# Patient Record
Sex: Male | Born: 1965 | Race: White | Hispanic: No | Marital: Married | Smoking: Never smoker
Health system: Southern US, Community
[De-identification: ages and names within clinical notes are randomized; demographics above are authoritative.]

## PROBLEM LIST (undated history)

## (undated) DIAGNOSIS — M109 Gout, unspecified: Principal | ICD-10-CM

## (undated) DIAGNOSIS — M1990 Unspecified osteoarthritis, unspecified site: Secondary | ICD-10-CM

## (undated) DIAGNOSIS — I2699 Other pulmonary embolism without acute cor pulmonale: Secondary | ICD-10-CM

## (undated) HISTORY — PX: CHOLECYSTECTOMY: SHX55

## (undated) HISTORY — DX: Gout, unspecified: M10.9

---

## 2013-02-14 ENCOUNTER — Ambulatory Visit: Payer: Self-pay | Admitting: Family Medicine

## 2013-02-14 DIAGNOSIS — Z0289 Encounter for other administrative examinations: Secondary | ICD-10-CM

## 2013-02-15 ENCOUNTER — Ambulatory Visit (INDEPENDENT_AMBULATORY_CARE_PROVIDER_SITE_OTHER): Payer: BC Managed Care – PPO | Admitting: Family Medicine

## 2013-02-15 ENCOUNTER — Encounter: Payer: Self-pay | Admitting: Family Medicine

## 2013-02-15 VITALS — BP 114/66 | HR 63 | Wt 189.0 lb

## 2013-02-15 DIAGNOSIS — M109 Gout, unspecified: Secondary | ICD-10-CM

## 2013-02-15 DIAGNOSIS — R5381 Other malaise: Secondary | ICD-10-CM

## 2013-02-15 DIAGNOSIS — R079 Chest pain, unspecified: Secondary | ICD-10-CM

## 2013-02-15 DIAGNOSIS — R5383 Other fatigue: Secondary | ICD-10-CM | POA: Insufficient documentation

## 2013-02-15 HISTORY — DX: Gout, unspecified: M10.9

## 2013-02-15 MED ORDER — COLCHICINE 0.6 MG PO TABS: 0.6000 mg | ORAL_TABLET | Freq: Every day | ORAL | Status: DC

## 2013-02-15 MED ORDER — ALLOPURINOL 100 MG PO TABS: 100.0000 mg | ORAL_TABLET | Freq: Every day | ORAL | Status: DC

## 2013-02-15 MED ORDER — MELOXICAM 15 MG PO TABS: 15.0000 mg | ORAL_TABLET | Freq: Every day | ORAL | Status: DC

## 2013-02-15 MED ORDER — CYCLOBENZAPRINE HCL 10 MG PO TABS: ORAL_TABLET | ORAL | Status: DC
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
## 2013-03-18 ENCOUNTER — Ambulatory Visit: Payer: BC Managed Care – PPO | Admitting: Family Medicine

## 2013-03-18 DIAGNOSIS — Z0289 Encounter for other administrative examinations: Secondary | ICD-10-CM

## 2015-07-03 ENCOUNTER — Ambulatory Visit (INDEPENDENT_AMBULATORY_CARE_PROVIDER_SITE_OTHER): Payer: BLUE CROSS/BLUE SHIELD | Admitting: Family Medicine

## 2015-07-03 ENCOUNTER — Encounter: Payer: Self-pay | Admitting: Family Medicine

## 2015-07-03 VITALS — BP 140/85 | HR 61 | Wt 196.0 lb

## 2015-07-03 DIAGNOSIS — M1A9XX1 Chronic gout, unspecified, with tophus (tophi): Secondary | ICD-10-CM

## 2015-07-03 DIAGNOSIS — N529 Male erectile dysfunction, unspecified: Secondary | ICD-10-CM | POA: Diagnosis not present

## 2015-07-03 DIAGNOSIS — M10079 Idiopathic gout, unspecified ankle and foot: Secondary | ICD-10-CM | POA: Diagnosis not present

## 2015-07-03 DIAGNOSIS — M109 Gout, unspecified: Secondary | ICD-10-CM | POA: Diagnosis not present

## 2015-07-03 MED ORDER — COLCHICINE 0.6 MG PO TABS: 0.6000 mg | ORAL_TABLET | Freq: Every day | ORAL | Status: DC

## 2015-07-03 MED ORDER — SILDENAFIL CITRATE 20 MG PO TABS: ORAL_TABLET | ORAL | Status: DC

## 2015-07-03 MED ORDER — ALLOPURINOL 300 MG PO TABS: 300.0000 mg | ORAL_TABLET | Freq: Every day | ORAL | Status: DC

## 2015-07-03 MED ORDER — PREDNISONE 20 MG PO TABS: ORAL_TABLET | ORAL | Status: AC

## 2015-07-03 NOTE — Progress Notes (Signed)
CC: Grant Ruiz is a 49 y.o. male is here for hospital f/u   Subjective: HPI:  Left ankle pain present for the last 3 weeks, present on a daily basis. He was evaluated at a local emergency room and he had normal x-rays. Symptoms are located deep in the ankle and on the front of the ankle. It stays swollen but is worse after a day of walking. Pain is worse when going down a stair or stepping off a curb. Treatment has included ibuprofen and oxycodone however he does not like taking oxycodone due to side effects and fear of getting addicted. Occasionally the joint feels warm. Same thing happen in the right ankle one month ago but improved after a few days of wearing a cough ankle brace. There is been no fevers, chills, nor joint swelling elsewhere. Denies any overlying skin changes other than swelling and some redness. Denies any recent or remote trauma to the joint.  Complaints of difficulty initiating and maintaining an erection for at least a few months now. He denies any other genitourinary complaints. He is still physically attracted towards his wife however cannot engage in sexual encounters due to erectile issues. Denies penile pain or any discharge. No interventions as of yet   Review Of Systems Outlined In HPI  Past Medical History  Diagnosis Date  . Gout 02/15/2013    No past surgical history on file. Family History  Problem Relation Age of Onset  . Alcoholism Mother   . Diabetes Father   . Hyperlipidemia Father     mother  . Hypertension Father     mother  . Stroke Father     Social History   Social History  . Marital Status: Married    Spouse Name: N/A  . Number of Children: N/A  . Years of Education: N/A   Occupational History  . Not on file.   Social History Main Topics  . Smoking status: Never Smoker   . Smokeless tobacco: Current User    Types: Chew  . Alcohol Use: No  . Drug Use: No  . Sexual Activity: Yes   Other Topics Concern  . Not on file    Social History Narrative     Objective: BP 140/85 mmHg  Pulse 61  Wt 196 lb (88.905 kg)  Vital signs reviewed. General: Alert and Oriented, No Acute Distress HEENT: Pupils equal, round, reactive to light. Conjunctivae clear.  External ears unremarkable.  Moist mucous membranes. Lungs: Clear and comfortable work of breathing, speaking in full sentences without accessory muscle use. Cardiac: Regular rate and rhythm.  Neuro: CN II-XII grossly intact, gait normal. Extremities: .  Strong peripheral pulses. Mild swelling mostly lateral in the left ankle. Pain is localized over the ATF ligament and also deeper. Pain is reproduced with resisted ankle flexion or extension. No pain with inversion or eversion. Mental Status: No depression, anxiety, nor agitation. Logical though process. Skin: Warm and dry.  Assessment & Plan: Grant Ruiz was seen today for hospital f/u.  Diagnoses and all orders for this visit:  Gout of ankle, unspecified cause, unspecified chronicity, unspecified laterality  Gout with tophus, unspecified cause, unspecified chronicity, unspecified site -     allopurinol (ZYLOPRIM) 300 MG tablet; Take 1 tablet (300 mg total) by mouth daily.  Gout without tophus, unspecified cause, unspecified chronicity, unspecified site -     colchicine (COLCRYS) 0.6 MG tablet; Take 1 tablet (0.6 mg total) by mouth daily. Take for three months after change in allopurinol dose.  Erectile dysfunction, unspecified erectile dysfunction type -     sildenafil (REVATIO) 20 MG tablet; 2-4 by mouth 30 minutes prior to sex.  Other orders -     predniSONE (DELTASONE) 20 MG tablet; Three tabs daily days 1-3, two tabs daily days 4-6, one tab daily days 7-9, half tab daily days 10-13.   . Gout flare of the left ankle, he's had at least 10 of these flares in the last 9 months therefore start allopurinol and colchicine to prevent future occurrences. I've given him an immobilizer as long as it doesn't  make his symptoms worse to wear until he is feeling better. Encouraged follow-up in our sports medicine clinic for any future occurrences. Erectile dysfunction: Start generic Viagra, he was given information about [facility] who offers this as a reasonable price if he pays out of pocket.  25 minutes spent face-to-face during visit today of which at least 50% was counseling or coordinating care regarding: 1. Gout of ankle, unspecified cause, unspecified chronicity, unspecified laterality   2. Gout with tophus, unspecified cause, unspecified chronicity, unspecified site   3. Gout without tophus, unspecified cause, unspecified chronicity, unspecified site   4. Erectile dysfunction, unspecified erectile dysfunction type      Return in about 3 months (around 10/02/2015) for gout follow up.

## 2015-07-03 NOTE — Patient Instructions (Signed)
Wear the ankle immobilizer daily for at least five days and if no improvement call our office at [phone] to request an appointment with one of our sports medicine doctors. Prednisone is to treat this current flare of gout. Allopurinol and colchicine is to prevent future flares of gout.

## 2015-09-18 ENCOUNTER — Encounter: Payer: Self-pay | Admitting: Family Medicine

## 2015-09-18 ENCOUNTER — Ambulatory Visit (INDEPENDENT_AMBULATORY_CARE_PROVIDER_SITE_OTHER): Payer: BLUE CROSS/BLUE SHIELD

## 2015-09-18 ENCOUNTER — Ambulatory Visit (INDEPENDENT_AMBULATORY_CARE_PROVIDER_SITE_OTHER): Payer: BLUE CROSS/BLUE SHIELD | Admitting: Family Medicine

## 2015-09-18 VITALS — BP 129/82 | HR 77 | Wt 195.0 lb

## 2015-09-18 DIAGNOSIS — M79672 Pain in left foot: Secondary | ICD-10-CM | POA: Insufficient documentation

## 2015-09-18 DIAGNOSIS — M2012 Hallux valgus (acquired), left foot: Secondary | ICD-10-CM

## 2015-09-18 DIAGNOSIS — M25475 Effusion, left foot: Secondary | ICD-10-CM | POA: Diagnosis not present

## 2015-09-18 DIAGNOSIS — M109 Gout, unspecified: Secondary | ICD-10-CM

## 2015-09-18 MED ORDER — HYDROCODONE-ACETAMINOPHEN 7.5-325 MG PO TABS: 1.0000 | ORAL_TABLET | Freq: Three times a day (TID) | ORAL | Status: DC | PRN

## 2015-09-18 MED ORDER — COLCHICINE 0.6 MG PO CAPS: 1.0000 | ORAL_CAPSULE | Freq: Every day | ORAL | Status: DC

## 2015-09-18 MED ORDER — CEFUROXIME AXETIL 500 MG PO TABS: 500.0000 mg | ORAL_TABLET | Freq: Two times a day (BID) | ORAL | Status: DC

## 2015-09-18 NOTE — Patient Instructions (Signed)
Thank you for coming in today. Take colchicine twice daily for a few days then once daily. Use hydrocodone for severe pain as needed. Take Ceftin twice daily for antibiotics for possible skin infection. Use the boot is needed for pain. Return on Monday if not better. Do not drive after taking hydrocodone for pain.  Gout Gout is an inflammatory arthritis caused by a buildup of uric acid crystals in the joints. Uric acid is a chemical that is normally present in the blood. When the level of uric acid in the blood is too high it can form crystals that deposit in your joints and tissues. This causes joint redness, soreness, and swelling (inflammation). Repeat attacks are common. Over time, uric acid crystals can form into masses (tophi) near a joint, destroying bone and causing disfigurement. Gout is treatable and often preventable. CAUSES  The disease begins with elevated levels of uric acid in the blood. Uric acid is produced by your body when it breaks down a naturally found substance called purines. Certain foods you eat, such as meats and fish, contain high amounts of purines. Causes of an elevated uric acid level include:  Being passed down from parent to child (heredity).  Diseases that cause increased uric acid production (such as obesity, psoriasis, and certain cancers).  Excessive alcohol use.  Diet, especially diets rich in meat and seafood.  Medicines, including certain cancer-fighting medicines (chemotherapy), water pills (diuretics), and aspirin.  Chronic kidney disease. The kidneys are no longer able to remove uric acid well.  Problems with metabolism. Conditions strongly associated with gout include:  Obesity.  High blood pressure.  High cholesterol.  Diabetes. Not everyone with elevated uric acid levels gets gout. It is not understood why some people get gout and others do not. Surgery, joint injury, and eating too much of certain foods are some of the factors that can  lead to gout attacks. SYMPTOMS   An attack of gout comes on quickly. It causes intense pain with redness, swelling, and warmth in a joint.  Fever can occur.  Often, only one joint is involved. Certain joints are more commonly involved:  Base of the big toe.  Knee.  Ankle.  Wrist.  Finger. Without treatment, an attack usually goes away in a few days to weeks. Between attacks, you usually will not have symptoms, which is different from many other forms of arthritis. DIAGNOSIS  Your caregiver will suspect gout based on your symptoms and exam. In some cases, tests may be recommended. The tests may include:  Blood tests.  Urine tests.  X-rays.  Joint fluid exam. This exam requires a needle to remove fluid from the joint (arthrocentesis). Using a microscope, gout is confirmed when uric acid crystals are seen in the joint fluid. TREATMENT  There are two phases to gout treatment: treating the sudden onset (acute) attack and preventing attacks (prophylaxis).  Treatment of an Acute Attack.  Medicines are used. These include anti-inflammatory medicines or steroid medicines.  An injection of steroid medicine into the affected joint is sometimes necessary.  The painful joint is rested. Movement can worsen the arthritis.  You may use warm or cold treatments on painful joints, depending which works best for you.  Treatment to Prevent Attacks.  If you suffer from frequent gout attacks, your caregiver may advise preventive medicine. These medicines are started after the acute attack subsides. These medicines either help your kidneys eliminate uric acid from your body or decrease your uric acid production. You may need to  stay on these medicines for a very long time.  The early phase of treatment with preventive medicine can be associated with an increase in acute gout attacks. For this reason, during the first few months of treatment, your caregiver may also advise you to take medicines  usually used for acute gout treatment. Be sure you understand your caregiver's directions. Your caregiver may make several adjustments to your medicine dose before these medicines are effective.  Discuss dietary treatment with your caregiver or dietitian. Alcohol and drinks high in sugar and fructose and foods such as meat, poultry, and seafood can increase uric acid levels. Your caregiver or dietitian can advise you on drinks and foods that should be limited. HOME CARE INSTRUCTIONS   Do not take aspirin to relieve pain. This raises uric acid levels.  Only take over-the-counter or prescription medicines for pain, discomfort, or fever as directed by your caregiver.  Rest the joint as much as possible. When in bed, keep sheets and blankets off painful areas.  Keep the affected joint raised (elevated).  Apply warm or cold treatments to painful joints. Use of warm or cold treatments depends on which works best for you.  Use crutches if the painful joint is in your leg.  Drink enough fluids to keep your urine clear or pale yellow. This helps your body get rid of uric acid. Limit alcohol, sugary drinks, and fructose drinks.  Follow your dietary instructions. Pay careful attention to the amount of protein you eat. Your daily diet should emphasize fruits, vegetables, whole grains, and fat-free or low-fat milk products. Discuss the use of coffee, vitamin C, and cherries with your caregiver or dietitian. These may be helpful in lowering uric acid levels.  Maintain a healthy body weight. SEEK MEDICAL CARE IF:   You develop diarrhea, vomiting, or any side effects from medicines.  You do not feel better in 24 hours, or you are getting worse. SEEK IMMEDIATE MEDICAL CARE IF:   Your joint becomes suddenly more tender, and you have chills or a fever. MAKE SURE YOU:   Understand these instructions.  Will watch your condition.  Will get help right away if you are not doing well or get worse.     This information is not intended to replace advice given to you by your health care provider. Make sure you discuss any questions you have with your health care provider.   Document Released: 09/30/2000 Document Revised: 10/24/2014 Document Reviewed: 05/16/2012 Elsevier Interactive Patient Education [employer]

## 2015-09-18 NOTE — Progress Notes (Signed)
   Subjective:    I'm seeing this patient as a consultation for:  Dr Ivan AnchorsHommel  CC: Left foot pain and swelling.   HPI: Symptoms present for one week. Patient denies any injury. No radiating pain weakness or numbness fevers or chills. Pain is diffuse but mostly located at the first MTP and in the lateral midfoot. He also notes pain extending up the lateral posterior ankle. He's been limping. He denies any change in activity or medications. No fevers chills nausea vomiting or diarrhea. He's concerned he may have an infection or gout flare.  Past medical history, Surgical history, Family history not pertinant except as noted below, Social history, Allergies, and medications have been entered into the medical record, reviewed, and no changes needed.   Review of Systems: No headache, visual changes, nausea, vomiting, diarrhea, constipation, dizziness, abdominal pain, skin rash, fevers, chills, night sweats, weight loss, swollen lymph nodes, body aches, joint swelling, muscle aches, chest pain, shortness of breath, mood changes, visual or auditory hallucinations.   Objective:    Filed Vitals:   09/18/15 1056  BP: 129/82  Pulse: 77   General: Well Developed, well nourished, and in no acute distress.  Neuro/Psych: Alert and oriented x3, extra-ocular muscles intact, able to move all 4 extremities, sensation grossly intact. Skin: Warm and dry, no rashes noted.  Respiratory: Not using accessory muscles, speaking in full sentences, trachea midline.  Cardiovascular: Pulses palpable, no extremity edema. Abdomen: Does not appear distended. MSK: Left foot is erythematous and tender especially at the first MTP in the lateral foot. He has scaling and crusting of the interdigital web spaces. The foot is erythematous but there is no definitive induration or fluctuance. Pulses capillary refill are intact. Motion at the toes is limited by pain.  X-ray left foot: Preliminary read shows no obvious fractures.  Awaiting formal radiology review.  No results found for this or any previous visit (from the past 24 hour(s)). No results found.  Impression and Recommendations:   This case required medical decision making of moderate complexity.

## 2015-09-18 NOTE — Assessment & Plan Note (Signed)
Very likely to be a gout flare. Will treat with colchicine and Norco. Additionally patient will use cam walker boot that he has at home. Work restriction in place as well. Very small chance this is cellulitis however the chances large enough I feel it's reasonable to treat empirically with Ceftin. Return on Monday if not better. Work note provided.

## 2015-09-21 NOTE — Progress Notes (Signed)
Quick Note:  Xray shows no fracture ______ 

## 2015-10-22 ENCOUNTER — Ambulatory Visit (INDEPENDENT_AMBULATORY_CARE_PROVIDER_SITE_OTHER): Payer: BLUE CROSS/BLUE SHIELD | Admitting: Family Medicine

## 2015-10-22 ENCOUNTER — Telehealth: Payer: Self-pay

## 2015-10-22 ENCOUNTER — Encounter: Payer: Self-pay | Admitting: Family Medicine

## 2015-10-22 VITALS — BP 143/93 | HR 67 | Wt 197.0 lb

## 2015-10-22 DIAGNOSIS — M1A9XX1 Chronic gout, unspecified, with tophus (tophi): Secondary | ICD-10-CM

## 2015-10-22 DIAGNOSIS — N529 Male erectile dysfunction, unspecified: Secondary | ICD-10-CM | POA: Diagnosis not present

## 2015-10-22 DIAGNOSIS — M1380 Other specified arthritis, unspecified site: Secondary | ICD-10-CM | POA: Diagnosis not present

## 2015-10-22 LAB — COMPLETE METABOLIC PANEL WITH GFR
ALK PHOS: 70 U/L (ref 40–115)
ALT: 13 U/L (ref 9–46)
AST: 15 U/L (ref 10–40)
Albumin: 4.4 g/dL (ref 3.6–5.1)
BUN: 14 mg/dL (ref 7–25)
CHLORIDE: 104 mmol/L (ref 98–110)
CO2: 22 mmol/L (ref 20–31)
Calcium: 9.6 mg/dL (ref 8.6–10.3)
Creat: 0.98 mg/dL (ref 0.60–1.35)
GFR, Est African American: >89 mL/min (ref >=60)
GLUCOSE: 70 mg/dL (ref 65–99)
POTASSIUM: 4 mmol/L (ref 3.5–5.3)
SODIUM: 140 mmol/L (ref 135–146)
Total Bilirubin: 0.6 mg/dL (ref 0.2–1.2)
Total Protein: 7.2 g/dL (ref 6.1–8.1)

## 2015-10-22 LAB — CBC
HCT: 41.8 % (ref 39.0–52.0)
Hemoglobin: 14.5 g/dL (ref 13.0–17.0)
MCH: 29.7 pg (ref 26.0–34.0)
MCHC: 34.7 g/dL (ref 30.0–36.0)
MCV: 85.5 fL (ref 78.0–100.0)
MPV: 9.6 fL (ref 8.6–12.4)
PLATELETS: 240 10*3/uL (ref 150–400)
RBC: 4.89 MIL/uL (ref 4.22–5.81)
RDW: 14.1 % (ref 11.5–15.5)
WBC: 7.8 10*3/uL (ref 4.0–10.5)

## 2015-10-22 MED ORDER — OXYCODONE HCL 5 MG PO TABS: 5.0000 mg | ORAL_TABLET | Freq: Four times a day (QID) | ORAL | Status: DC | PRN

## 2015-10-22 MED ORDER — METHOTREXATE SODIUM 10 MG PO TABS: 40.0000 mg | ORAL_TABLET | ORAL | Status: DC

## 2015-10-22 MED ORDER — MELOXICAM 15 MG PO TABS: 15.0000 mg | ORAL_TABLET | Freq: Every day | ORAL | Status: DC

## 2015-10-22 MED ORDER — FOLIC ACID 1 MG PO TABS: 1.0000 mg | ORAL_TABLET | Freq: Every day | ORAL | Status: DC

## 2015-10-22 MED ORDER — ALLOPURINOL 300 MG PO TABS: 300.0000 mg | ORAL_TABLET | Freq: Every day | ORAL | Status: AC

## 2015-10-22 MED ORDER — FOLIC ACID 1 MG PO TABS: 1.0000 mg | ORAL_TABLET | Freq: Every day | ORAL | Status: AC

## 2015-10-22 MED ORDER — PREDNISONE 20 MG PO TABS: ORAL_TABLET | ORAL | Status: AC

## 2015-10-22 MED ORDER — METHOTREXATE 2.5 MG PO TABS: 10.0000 mg | ORAL_TABLET | ORAL | Status: DC

## 2015-10-22 MED ORDER — SILDENAFIL CITRATE 20 MG PO TABS: ORAL_TABLET | ORAL | Status: DC

## 2015-10-22 NOTE — Progress Notes (Signed)
CC: Grant CoffinChristopher Ruiz is a 50 y.o. male is here for Hospitalization Follow-up   Subjective: HPI:  Earlier this month he was hospitalized for a little over a week due to left foot pain that was eventually felt to be due to cellulitis and seronegative inflammatory arthritis. He had an MRI showing inflammatory changes in the left foot, a joint aspiration showed white blood cells but no crystals. He was treated with clindamycin, steroids and started on methotrexate. He tells me the pain in his left foot is improving however he starting to get similar pain in his right foot localized on the lateral posterior aspect of the foot. He denies any swelling redness or warmth of the right foot. He denies any recent injury. He was asked to follow up with [facility] however for reasons that are not completely clear he decided not to do this and would rather have a nonacademic rheumatology office.  Is requesting a refill on generic Viagra. It seems to be helping with erectile dysfunction  Review Of Systems Outlined In HPI  Past Medical History  Diagnosis Date  . Gout 02/15/2013    No past surgical history on file. Family History  Problem Relation Age of Onset  . Alcoholism Mother   . Diabetes Father   . Hyperlipidemia Father     mother  . Hypertension Father     mother  . Stroke Father     Social History   Social History  . Marital Status: Married    Spouse Name: N/A  . Number of Children: N/A  . Years of Education: N/A   Occupational History  . Not on file.   Social History Main Topics  . Smoking status: Never Smoker   . Smokeless tobacco: Current User    Types: Chew  . Alcohol Use: No  . Drug Use: No  . Sexual Activity: Yes   Other Topics Concern  . Not on file   Social History Narrative     Objective: BP 143/93 mmHg  Pulse 67  Wt 197 lb (89.359 kg)  General: Alert and Oriented, No Acute Distress HEENT: Pupils equal, round, reactive to light. Conjunctivae  clear.  Moist mucous membranes Lungs: Clear to auscultation bilaterally, no wheezing/ronchi/rales.  Comfortable work of breathing. Good air movement. Cardiac: Regular rate and rhythm. Normal S1/S2.  No murmurs, rubs, nor gallops.   Abdomen: Normal bowel sounds, soft and non tender without palpable masses. Extremities: mild swelling over the left lateral malleolus, no swelling redness or warmth in the right foot. Pain is localized just below the lateral malleolus..  Strong peripheral pulses.  Mental Status: No depression, anxiety, nor agitation. Skin: Warm and dry.  Assessment & Plan: Grant Ruiz was seen today for hospitalization follow-up.  Diagnoses and all orders for this visit:  Gout with tophus, unspecified cause, unspecified chronicity, unspecified site -     allopurinol (ZYLOPRIM) 300 MG tablet; Take 1 tablet (300 mg total) by mouth daily.  Seronegative arthritis -     Discontinue: folic acid (FOLVITE) 1 MG tablet; Take 1 tablet (1 mg total) by mouth daily. -     oxyCODONE (ROXICODONE) 5 MG immediate release tablet; Take 1 tablet (5 mg total) by mouth every 6 (six) hours as needed for severe pain. -     meloxicam (MOBIC) 15 MG tablet; Take 1 tablet (15 mg total) by mouth daily. -     predniSONE (DELTASONE) 20 MG tablet; Three tabs daily days 1-3, two tabs daily days 4-6, one tab  daily days 7-9, half tab daily days 10-13. -     Ambulatory referral to Rheumatology -     methotrexate (RHEUMATREX) 10 MG tablet; Take 4 tablets (40 mg total) by mouth once a week. Caution: Chemotherapy. Protect from light. -     folic acid (FOLVITE) 1 MG tablet; Take 1 tablet (1 mg total) by mouth daily. -     CBC -     COMPLETE METABOLIC PANEL WITH GFR  Erectile dysfunction, unspecified erectile dysfunction type -     sildenafil (REVATIO) 20 MG tablet; 2-4 by mouth 30 minutes prior to sex.   Discussed a low suspicion that gout is causing his current pain in the right foot. Start on prednisone taper  beginning at 60 mg today for suspicion of a flare of his chronic arthritis. Per his request it seems reasonable to refer him to a nonacademic rheumatologist, continue methotrexate pending a CBC and metabolic panel today. Also provided with a frustration of meloxicam to try it prednisone is not completely effective. If meloxicam does not help he can continue to take a small course of oxycodone.  25 minutes spent face-to-face during visit today of which at least 50% was counseling or coordinating care regarding: 1. Gout with tophus, unspecified cause, unspecified chronicity, unspecified site   2. Seronegative arthritis   3. Erectile dysfunction, unspecified erectile dysfunction type       Return in about 3 months (around 01/20/2016).

## 2015-10-22 NOTE — Telephone Encounter (Signed)
The pharmacy called and the Methotrexate only comes in 2.5 mg. Please advise.

## 2015-10-22 NOTE — Telephone Encounter (Signed)
Will you please let pharmacy know that the corrected prescription has been sent in at a dose of Methotrexate 2.5mg  : four tablets at once every week.

## 2015-10-22 NOTE — Telephone Encounter (Signed)
[facility] notified.

## 2016-02-04 ENCOUNTER — Other Ambulatory Visit: Payer: Self-pay | Admitting: Family Medicine

## 2016-03-30 ENCOUNTER — Other Ambulatory Visit: Payer: Self-pay | Admitting: Family Medicine

## 2016-06-17 ENCOUNTER — Other Ambulatory Visit: Payer: Self-pay | Admitting: Family Medicine

## 2016-08-09 ENCOUNTER — Ambulatory Visit: Payer: BLUE CROSS/BLUE SHIELD | Admitting: Family Medicine

## 2016-08-22 ENCOUNTER — Ambulatory Visit: Payer: BLUE CROSS/BLUE SHIELD | Admitting: Osteopathic Medicine

## 2016-10-05 ENCOUNTER — Telehealth: Payer: Self-pay

## 2016-10-05 NOTE — Telephone Encounter (Signed)
Since this is a new patient to me, I would like to evaluate him before refilling any medications

## 2016-10-05 NOTE — Telephone Encounter (Signed)
Grant Ruiz is a former patient of Dr Grant Ruiz. He was last seen in January by Dr Grant Ruiz. He is going to transfer his care to Grant Keaharlie Cummings, PA-C. He would like a refill on sildenafil. This is not a medication we refill per protocol. Please advised. Confirmed pharmacy.

## 2016-10-06 NOTE — Telephone Encounter (Signed)
Pt advised, has appt next week and will discuss then. No further questions.

## 2016-10-12 ENCOUNTER — Ambulatory Visit: Payer: BLUE CROSS/BLUE SHIELD | Admitting: Physician Assistant

## 2017-07-05 IMAGING — CR DG FOOT COMPLETE 3+V*L*
3 series · 3 of 3 positions shown · non-contrast
Comparison: None.

CLINICAL DATA: Pt c/o left foot pain and swelling for over a week,
pt states its hot to the touch as well, denies any known injury or
trauma

EXAM:
LEFT FOOT - COMPLETE 3+ VIEW

[foot ap]
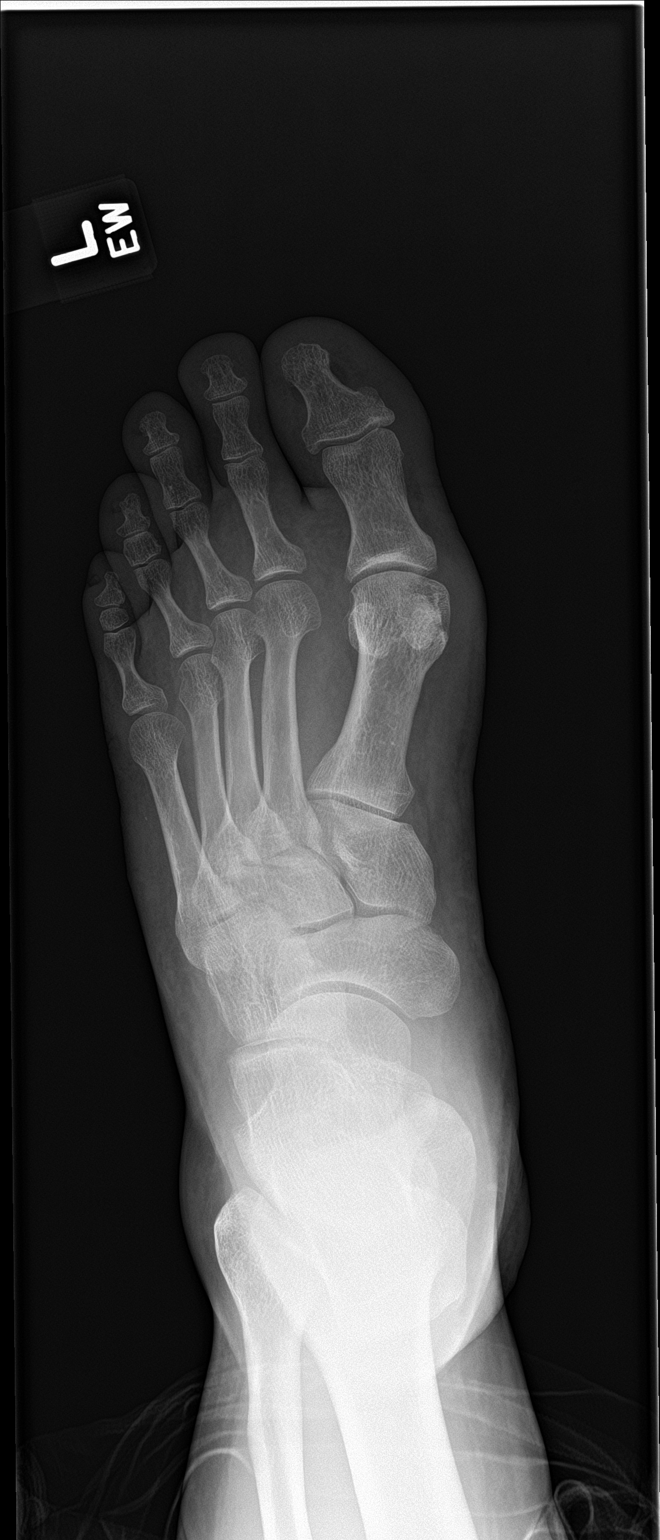

[foot obl]
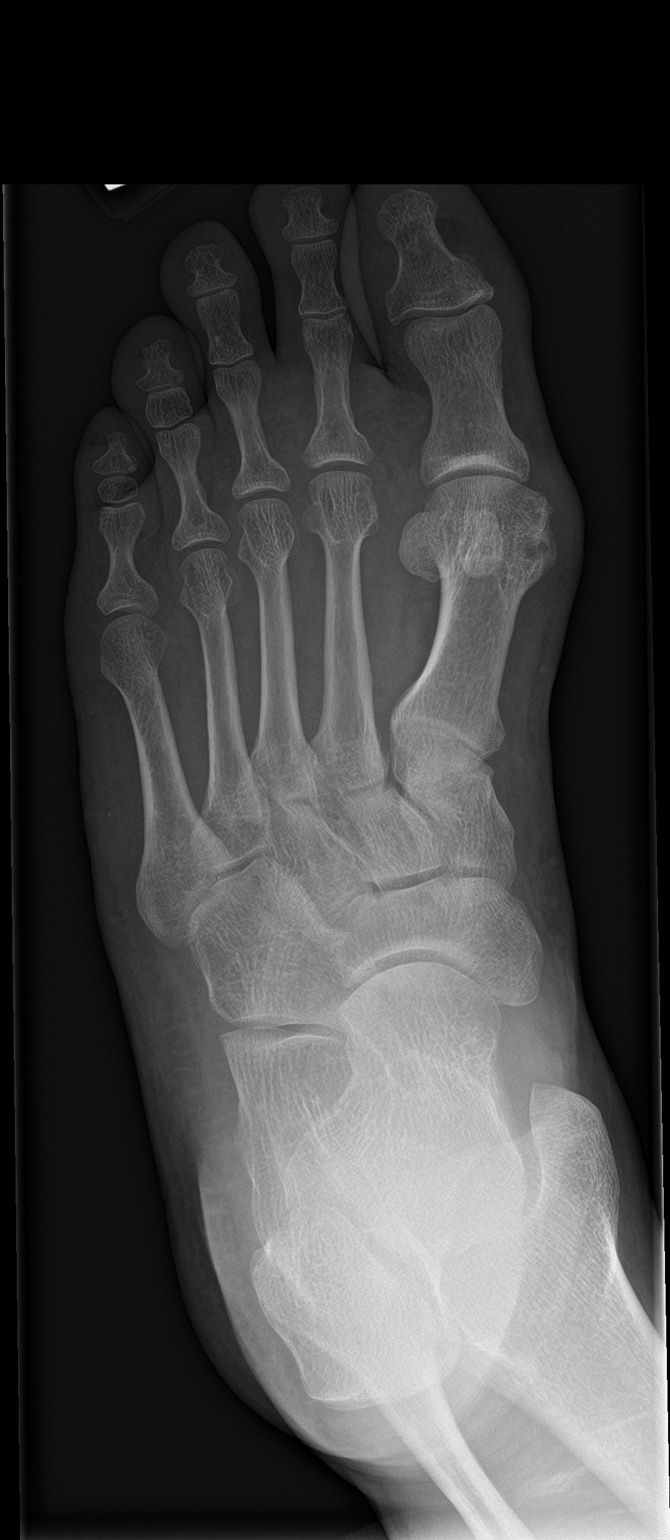

[foot lat]
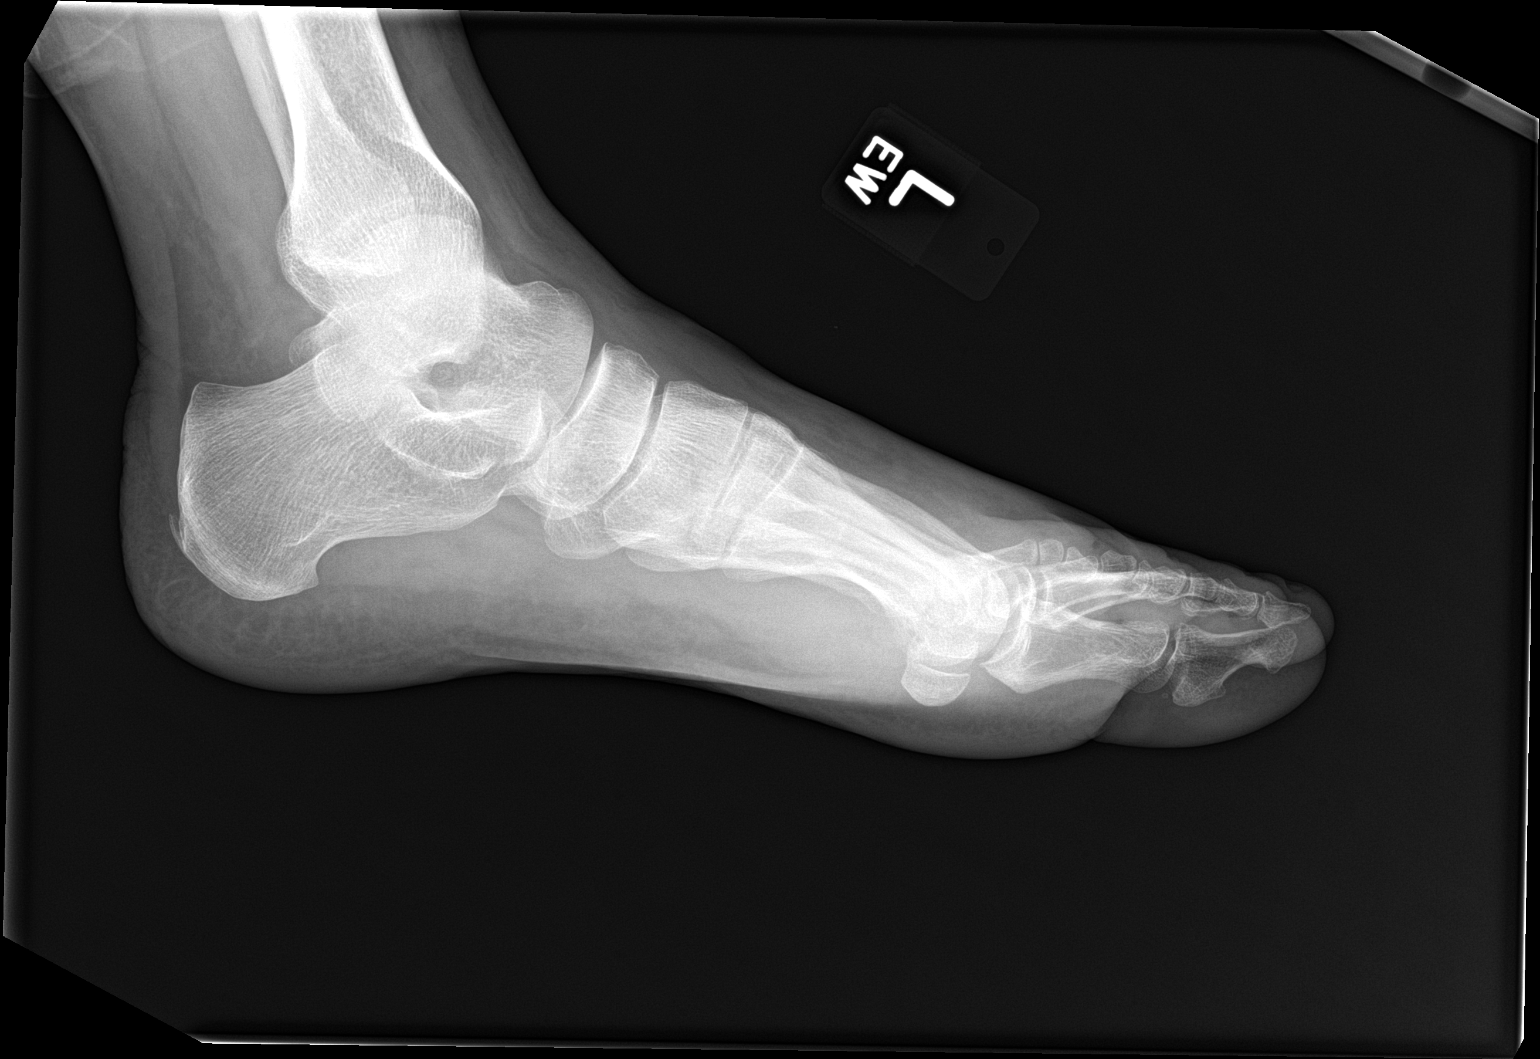

[3 of 3 positions shown; findings below may reference images not displayed]

FINDINGS: There is no evidence for acute fracture. Note is made of hallux
valgus deformity. There is soft tissue swelling her really along the
plantar aspect of the forefoot. No radiopaque foreign body or soft
tissue gas identified.
IMPRESSION: 1. No fracture.
2. Soft tissue swelling.
3. Hallux valgus deformity.

## 2022-11-12 ENCOUNTER — Other Ambulatory Visit: Payer: Self-pay

## 2022-11-12 ENCOUNTER — Ambulatory Visit (INDEPENDENT_AMBULATORY_CARE_PROVIDER_SITE_OTHER): Payer: Commercial Managed Care - PPO

## 2022-11-12 ENCOUNTER — Ambulatory Visit
Admission: EM | Admit: 2022-11-12 | Discharge: 2022-11-12 | Disposition: A | Discharge status: Home / Self Care | Payer: Commercial Managed Care - PPO

## 2022-11-12 DIAGNOSIS — S39012A Strain of muscle, fascia and tendon of lower back, initial encounter: Secondary | ICD-10-CM | POA: Diagnosis not present

## 2022-11-12 DIAGNOSIS — M542 Cervicalgia: Secondary | ICD-10-CM | POA: Diagnosis not present

## 2022-11-12 DIAGNOSIS — M545 Low back pain, unspecified: Secondary | ICD-10-CM | POA: Diagnosis not present

## 2022-11-12 DIAGNOSIS — S161XXA Strain of muscle, fascia and tendon at neck level, initial encounter: Secondary | ICD-10-CM | POA: Diagnosis not present

## 2022-11-12 HISTORY — DX: Unspecified osteoarthritis, unspecified site: M19.90

## 2022-11-12 HISTORY — DX: Other pulmonary embolism without acute cor pulmonale: I26.99

## 2022-11-12 MED ORDER — KETOROLAC TROMETHAMINE 30 MG/ML IJ SOLN: 30.0000 mg | Freq: Once | INTRAMUSCULAR | Status: DC

## 2022-11-12 MED ORDER — BACLOFEN 10 MG PO TABS: 10.0000 mg | ORAL_TABLET | Freq: Every day | ORAL | 0 refills | Status: AC

## 2022-11-12 MED ORDER — IBUPROFEN 800 MG PO TABS: 800.0000 mg | ORAL_TABLET | Freq: Three times a day (TID) | ORAL | 0 refills | Status: AC | PRN
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
## 2022-11-13 ENCOUNTER — Ambulatory Visit: Payer: Self-pay
